# Patient Record
Sex: Male | Born: 1990 | Race: White | Hispanic: No | Marital: Single | Smoking: Former smoker
Health system: Southern US, Community
[De-identification: ages and names within clinical notes are randomized; demographics above are authoritative.]

## PROBLEM LIST (undated history)

## (undated) DIAGNOSIS — R002 Palpitations: Secondary | ICD-10-CM

## (undated) DIAGNOSIS — I10 Essential (primary) hypertension: Secondary | ICD-10-CM

## (undated) HISTORY — DX: Palpitations: R00.2

## (undated) HISTORY — DX: Essential (primary) hypertension: I10

## (undated) HISTORY — PX: HERNIA REPAIR: SHX51

---
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
## 2011-10-04 ENCOUNTER — Emergency Department: Payer: Self-pay | Admitting: Emergency Medicine

## 2012-04-28 ENCOUNTER — Emergency Department: Payer: Self-pay | Admitting: Emergency Medicine
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
## 2013-10-18 IMAGING — CT CT HEAD WITHOUT CONTRAST
2 series · 16 of 30 positions shown, 20 images · non-contrast
Comparison: none

REASON FOR EXAM: headache/nausea/dizziness
COMMENTS:

PROCEDURE:     CT  - CT HEAD WITHOUT CONTRAST  - October 04, 2011  [DATE]
RESULT:     Technique: Helical 5mm sections were obtained from the skull
base to the vertex without administration of intravenous contrast.

[Series 2: without · axial · non-contrast · 0.43mm/px · z∈[+230,+350]mm · 13 of 29 slices shown, 17 images]
[im 3/29  brain]
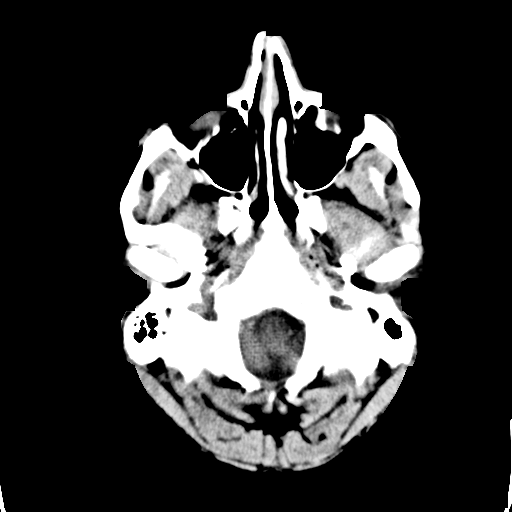
[im 3/29  bone]
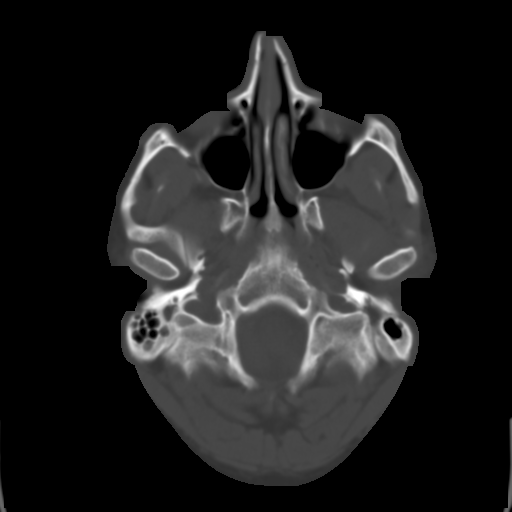
[im 5/29  brain]
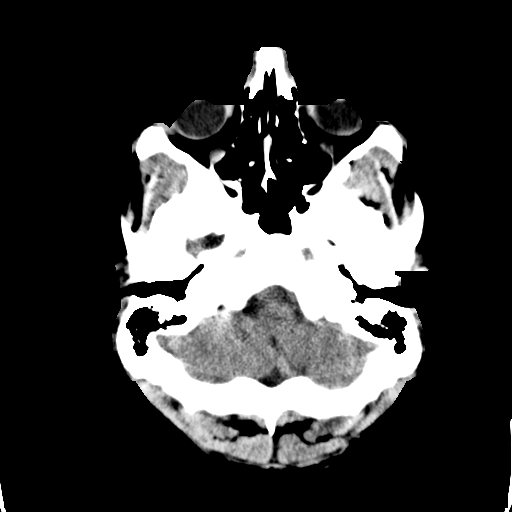
[im 7/29  brain]
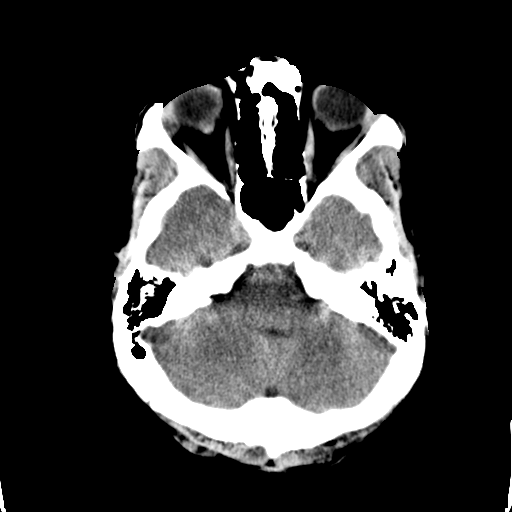
[im 9/29  brain]
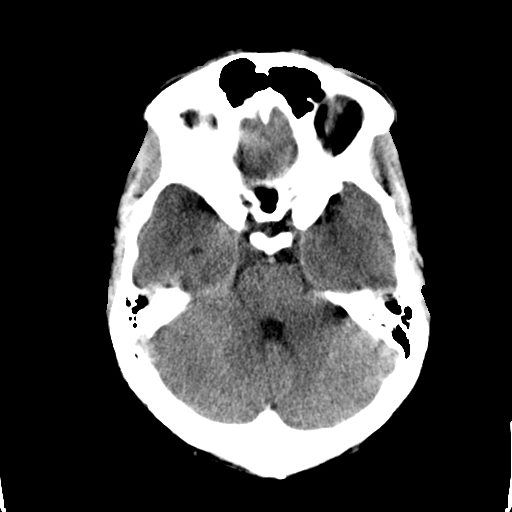
[im 11/29  brain]
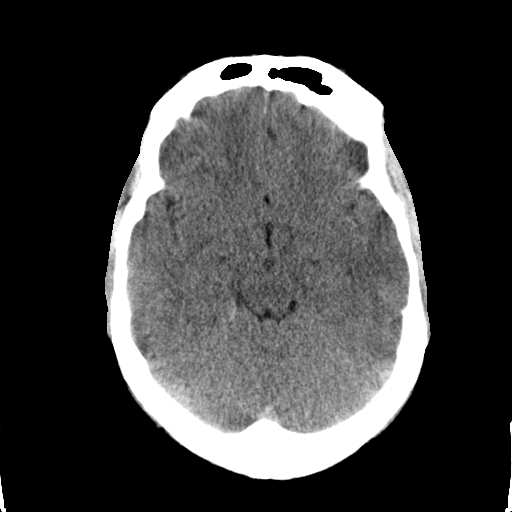
[im 11/29  bone]
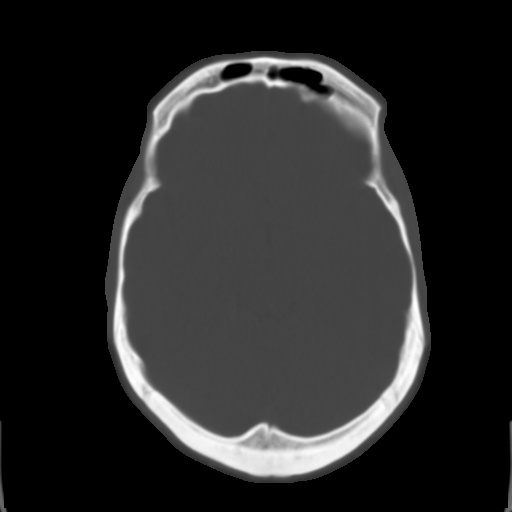
[im 13/29  brain]
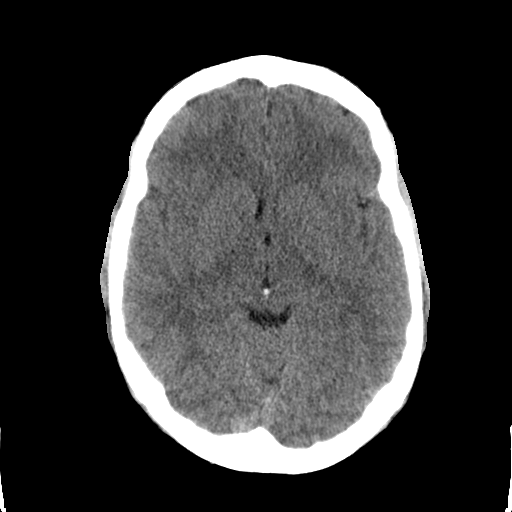
[im 15/29  brain]
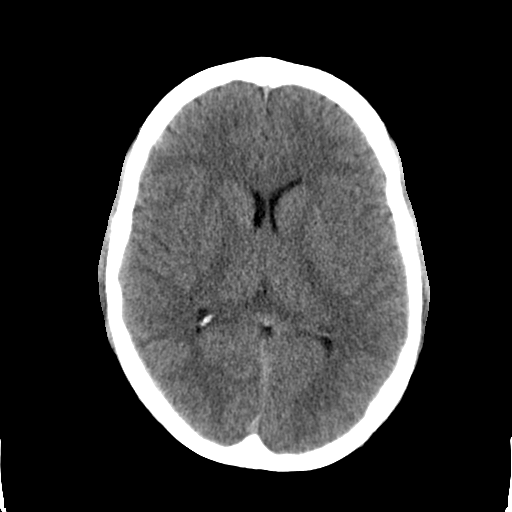
[im 17/29  brain]
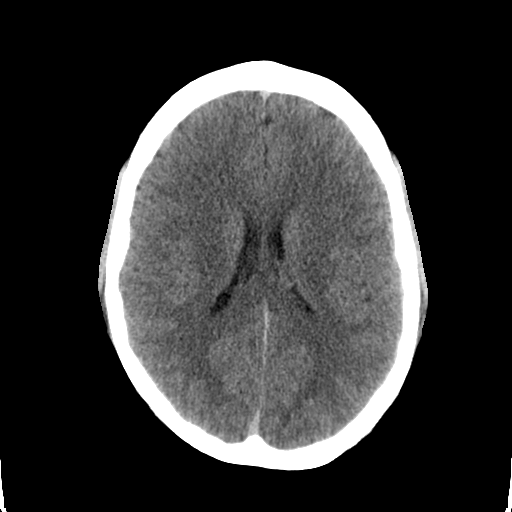
[im 19/29  brain]
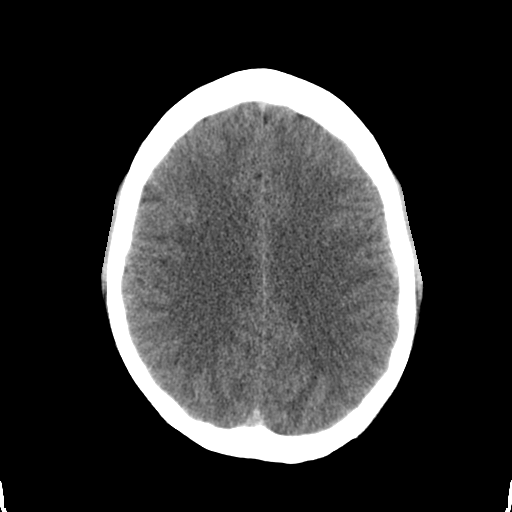
[im 19/29  bone]
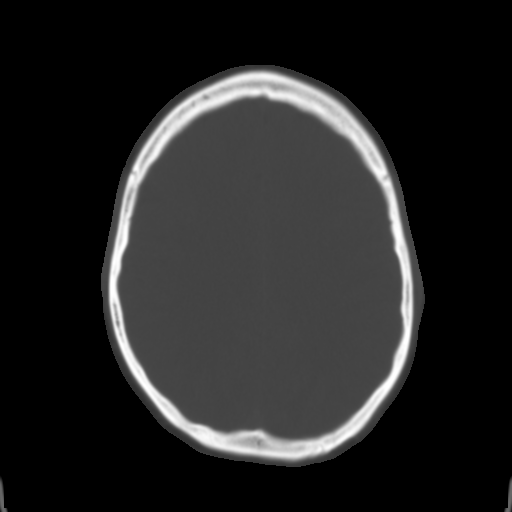
[im 21/29  brain]
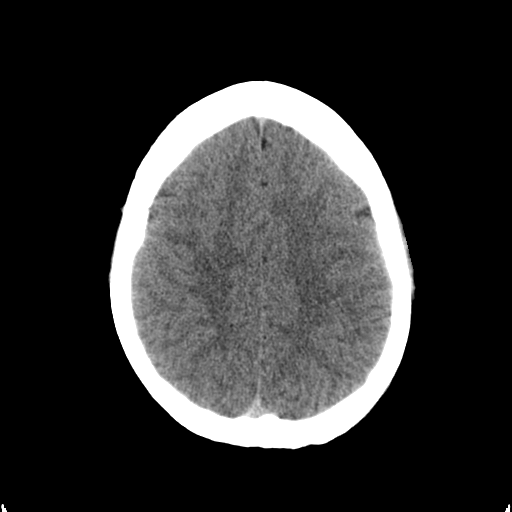
[im 23/29  brain]
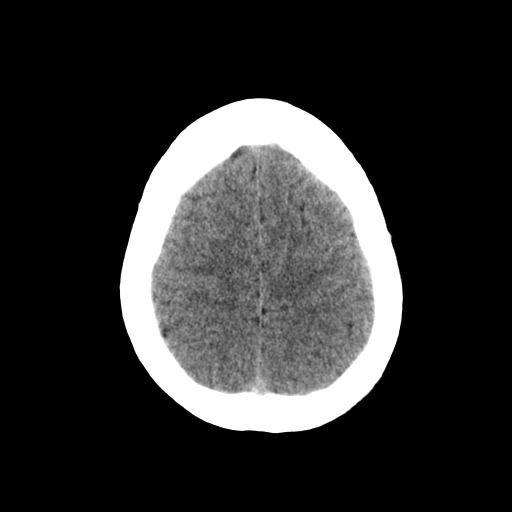
[im 25/29  brain]
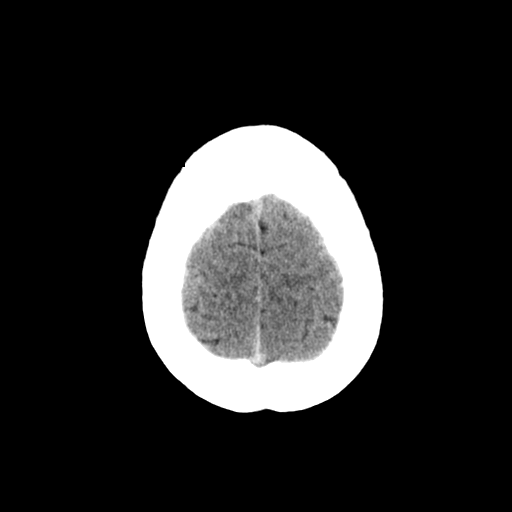
[im 27/29  brain]
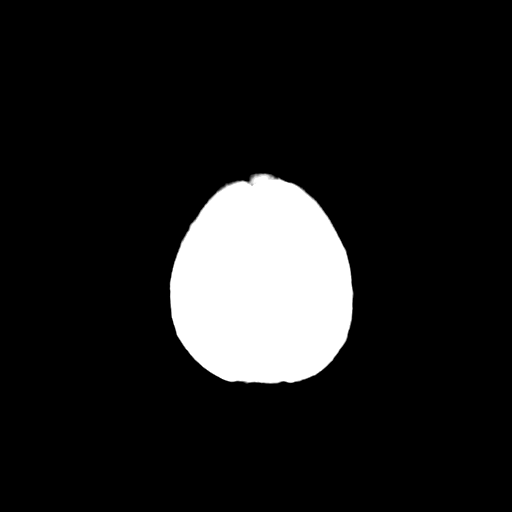
[im 27/29  bone]
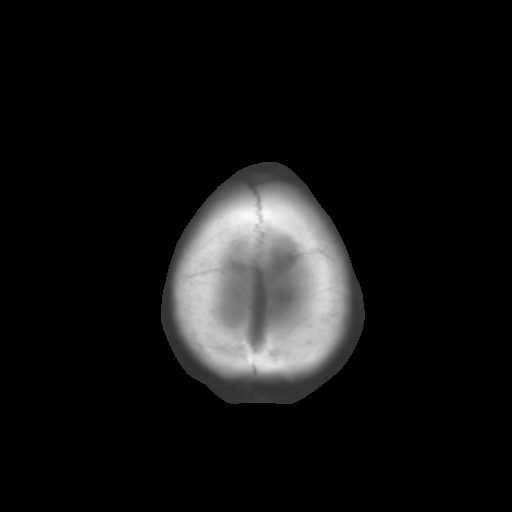

[Series 3: bone · axial · 0.43mm/px · z∈[+230,+270]mm · 3 of 29 slices shown]
[im 3/29  bone]
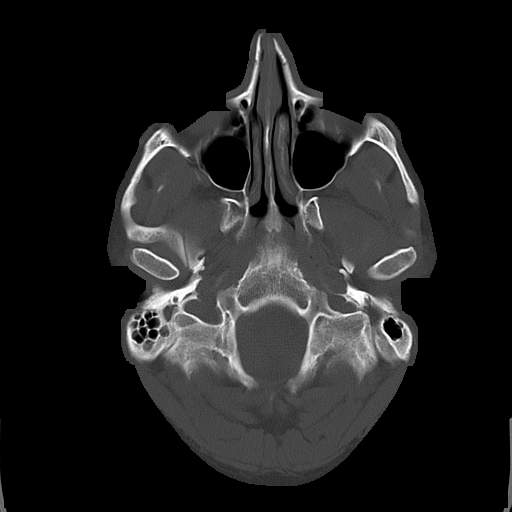
[im 7/29  bone]
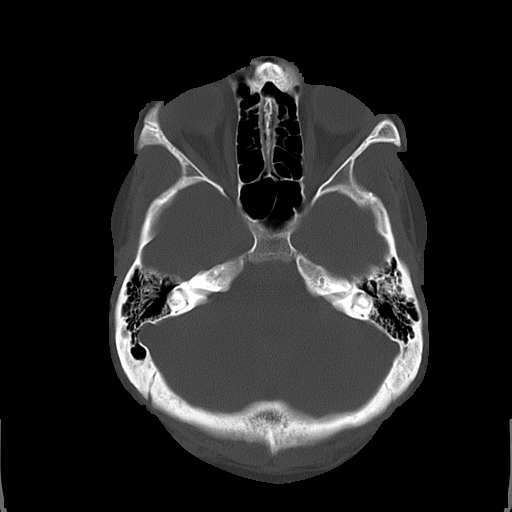
[im 11/29  bone]
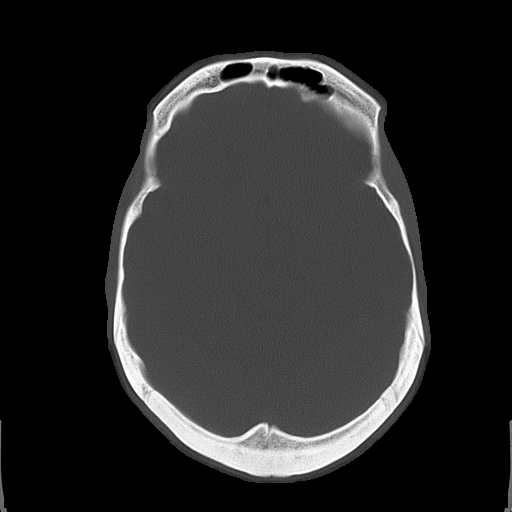

[16 of 30 positions shown; findings below may reference images not displayed]

FINDINGS: There is not evidence of intra-axial fluid collections. There is
no evidence of acute hemorrhage or secondary signs reflecting mass effect or
subacute or chronic focal territorial infarction. The osseous structures
demonstrate no evidence of a depressed skull fracture. If there is
persistent concern clinical follow-up with MRI is recommended.

Incidental note is made of minimally displaced nasal bone fractures seen on
the prior maxillofacial CT.
IMPRESSION: 1. No evidence of acute intracranial abnormalitites.

2. Dan Kato Bodin, PA was informed of these findings via a preliminary faxed
report.

## 2015-12-22 ENCOUNTER — Emergency Department
Admission: EM | Admit: 2015-12-22 | Discharge: 2015-12-23 | Disposition: A | Discharge status: Home / Self Care | Payer: 59 | Attending: Emergency Medicine | Admitting: Emergency Medicine

## 2015-12-22 ENCOUNTER — Encounter: Payer: Self-pay | Admitting: *Deleted

## 2015-12-22 DIAGNOSIS — R45851 Suicidal ideations: Secondary | ICD-10-CM

## 2015-12-22 DIAGNOSIS — F4325 Adjustment disorder with mixed disturbance of emotions and conduct: Secondary | ICD-10-CM | POA: Diagnosis not present

## 2015-12-22 DIAGNOSIS — Z5181 Encounter for therapeutic drug level monitoring: Secondary | ICD-10-CM | POA: Diagnosis not present

## 2015-12-22 DIAGNOSIS — Z046 Encounter for general psychiatric examination, requested by authority: Secondary | ICD-10-CM

## 2015-12-22 DIAGNOSIS — F4320 Adjustment disorder, unspecified: Secondary | ICD-10-CM | POA: Insufficient documentation
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
## 2015-12-22 NOTE — ED Notes (Signed)
Pt voices his concerns with being here and not understanding why. Pt provided with information on why he is here and what the process will be. Pt voices understanding at this time.

## 2015-12-23 DIAGNOSIS — Z046 Encounter for general psychiatric examination, requested by authority: Secondary | ICD-10-CM

## 2015-12-23 DIAGNOSIS — F4325 Adjustment disorder with mixed disturbance of emotions and conduct: Secondary | ICD-10-CM

## 2015-12-23 LAB — CBC
HEMATOCRIT: 43.7 % (ref 40.0–52.0)
HEMOGLOBIN: 15.7 g/dL (ref 13.0–18.0)
MCH: 30 pg (ref 26.0–34.0)
MCHC: 35.9 g/dL (ref 32.0–36.0)
MCV: 83.5 fL (ref 80.0–100.0)
Platelets: 234 10*3/uL (ref 150–440)
RBC: 5.23 MIL/uL (ref 4.40–5.90)
RDW: 12.8 % (ref 11.5–14.5)
WBC: 10.2 10*3/uL (ref 3.8–10.6)

## 2015-12-23 LAB — BASIC METABOLIC PANEL
ANION GAP: 8 (ref 5–15)
BUN: 14 mg/dL (ref 6–20)
CALCIUM: 9.1 mg/dL (ref 8.9–10.3)
CHLORIDE: 102 mmol/L (ref 101–111)
CO2: 28 mmol/L (ref 22–32)
CREATININE: 0.97 mg/dL (ref 0.61–1.24)
GFR calc non Af Amer: >60 mL/min (ref >60)
Glucose, Bld: 97 mg/dL (ref 65–99)
Potassium: 3.6 mmol/L (ref 3.5–5.1)
SODIUM: 138 mmol/L (ref 135–145)

## 2015-12-23 LAB — URINE DRUG SCREEN, QUALITATIVE (ARMC ONLY)
Amphetamines, Ur Screen: NOT DETECTED
BARBITURATES, UR SCREEN: NOT DETECTED
Benzodiazepine, Ur Scrn: NOT DETECTED
COCAINE METABOLITE, UR ~~LOC~~: NOT DETECTED
Cannabinoid 50 Ng, Ur ~~LOC~~: NOT DETECTED
MDMA (ECSTASY) UR SCREEN: NOT DETECTED
METHADONE SCREEN, URINE: NOT DETECTED
Opiate, Ur Screen: NOT DETECTED
Phencyclidine (PCP) Ur S: NOT DETECTED
TRICYCLIC, UR SCREEN: NOT DETECTED

## 2015-12-23 LAB — SALICYLATE LEVEL

## 2015-12-23 LAB — ACETAMINOPHEN LEVEL

## 2015-12-23 LAB — ETHANOL: Alcohol, Ethyl (B): <5 mg/dL (ref <5)

## 2015-12-23 NOTE — ED Notes (Signed)
Patient resting quietly in room. No noted distress or abnormal behaviors noted. Will continue 15 minute checks and observation by security camera for safety. 

## 2015-12-23 NOTE — Discharge Instructions (Signed)
You have been seen in the Emergency Department (ED)  today for a psychiatric complaint.  You have been evaluated by psychiatry and we believe you are safe to be discharged from the hospital.   ° °Please return to the Emergency Department (ED)  immediately if you have ANY thoughts of hurting yourself or anyone else, so that we may help you. ° °Please avoid alcohol and drug use. ° °Follow up with your doctor and/or therapist as soon as possible regarding today's ED  visit.  ° °You may call crisis hotline for [place] at [phone]. ° °

## 2015-12-23 NOTE — ED Notes (Signed)
Patient asleep in room. No noted distress or abnormal behavior. Will continue 15 minute checks and observation by security cameras for safety. 

## 2015-12-23 NOTE — ED Notes (Signed)
Patient currently denies SI/HI/AVH and pain. Patient insists that he said that he wanted to lay in front of a car because his fiance left with his child without saying where she was going. He states that he is connected with a therapist and has two jobs. He consistently requests to leave stating "this is not fair". Nurse provided reassurance and oriented patient to the unit. Maintained on 15 minute checks and observation by security camera for safety.
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
## 2015-12-23 NOTE — ED Provider Notes (Signed)
Patient evaluated and cleared by psychiatry for discharge. IVC lifted by psychiatry. Patient was referred to counseling.  Nita Sicklearolina Andriea Hasegawa, MD 12/23/15 1932

## 2015-12-23 NOTE — ED Notes (Signed)
Patient currently in room talking on the phone. Patient continuously asks when the doctor will arrive to evaluate and release him. Nurse continues to reassure patient. Patient remains cooperative. Maintained on 15 minute checks and observation by security camera for safety.

## 2015-12-23 NOTE — ED Notes (Signed)
Pateint currently in dayroom waiting for the psychiatrist to evaluate him. Patient states that he is feeling anxious to leave and that he has never been in a psychiatric hospital before. Patient is still calm and states that he will remain calm until psychiatrist comes. Maintained on 15 minute checks and observation by security camera for safety.
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
## 2015-12-23 NOTE — ED Notes (Signed)

## 2015-12-23 NOTE — ED Notes (Signed)

## 2015-12-23 NOTE — ED Provider Notes (Signed)
[facility] Emergency Department Provider Note   ____________________________________________  Time seen: Approximately 0045 AM  I have reviewed the triage vital signs and the nursing notes.   HISTORY  Chief Complaint IVC     HPI Donald Morasustin C Hill is a 25 y.o. male who comes into the hospital today due to a misunderstanding. The patient reports that he got into an argument with his wife and she wanted to leave. He reports that he told her he would lay in front of the car so she couldn't leave. The patient's wife called the police and the police brought him here. The patient denies any intent to harm himself. He reports that he just wants to go home. He reports that he's never done anything like this in the past denies any drinking or drugs and reports he has no problems with depression or anxiety. He reports that he did not do anything and he just wants to go home. The patient has no abdominal pain no chest pain or shortness of breath or blurry vision no hallucinations at this time.   History reviewed. No pertinent past medical history.  There are no active problems to display for this patient.   History reviewed. No pertinent past surgical history.  No current outpatient prescriptions  Allergies Review of patient's allergies indicates no known allergies.  History reviewed. No pertinent family history.  Social History Social History  Substance Use Topics  . Smoking status: Never Smoker   . Smokeless tobacco: None  . Alcohol Use: No    Review of Systems Constitutional: No fever/chills Eyes: No visual changes. ENT: No sore throat. Cardiovascular: Denies chest pain. Respiratory: Denies shortness of breath. Gastrointestinal: No abdominal pain.  No nausea, no vomiting.  No diarrhea.  No constipation. Genitourinary: Negative for dysuria. Musculoskeletal: Negative for back pain. Skin: Negative for rash. Neurological: Negative for headaches, focal  weakness or numbness.  10-point ROS otherwise negative.  ____________________________________________   PHYSICAL EXAM:  VITAL SIGNS: ED Triage Vitals  Enc Vitals Group     BP 12/22/15 2225 135/84 mmHg     Pulse Rate 12/22/15 2225 70     Resp 12/22/15 2225 18     Temp 12/22/15 2225 98.6 F (37 C)     Temp Source 12/22/15 2225 Oral     SpO2 12/22/15 2225 98 %     Weight 12/22/15 2225 235 lb (106.595 kg)     Height 12/22/15 2225 5\' 10"  (1.778 m)     Head Cir --      Peak Flow --      Pain Score --      Pain Loc --      Pain Edu? --      Excl. in GC? --     Constitutional: Alert and oriented. Well appearing and in no acute distress. Eyes: Conjunctivae are normal. PERRL. EOMI. Head: Atraumatic. Nose: No congestion/rhinnorhea. Mouth/Throat: Mucous membranes are moist.  Oropharynx non-erythematous. Cardiovascular: Normal rate, regular rhythm. Grossly normal heart sounds.  Good peripheral circulation. Respiratory: Normal respiratory effort.  No retractions. Lungs CTAB. Gastrointestinal: Soft and nontender. No distention. Positive bowel sounds Musculoskeletal: No lower extremity tenderness nor edema.   Neurologic:  Normal speech and language.  Skin:  Skin is warm, dry and intact.  Psychiatric: Mood and affect are normal.   ____________________________________________   LABS (all labs ordered are listed, but only abnormal results are displayed)  Labs Reviewed  ACETAMINOPHEN LEVEL - Abnormal; Notable for the following:  Acetaminophen (Tylenol), Serum <10 (*)    All other components within normal limits  CBC  BASIC METABOLIC PANEL  ETHANOL  URINE DRUG SCREEN, QUALITATIVE ([facility] ONLY)  SALICYLATE LEVEL   ____________________________________________  EKG  none ____________________________________________  RADIOLOGY  none ____________________________________________   PROCEDURES  Procedure(s) performed: None  Procedures  Critical Care performed:  No  ____________________________________________   INITIAL IMPRESSION / ASSESSMENT AND PLAN / ED COURSE  Pertinent labs & imaging results that were available during my care of the patient were reviewed by me and considered in my medical decision making (see chart for details).  This is a 25 year old male who comes into the hospital today with a concern for suicidal ideation. The patient was involuntarily committed by the police. He reports that he has not done anything wrong. The patient has some blood work that is pending at this time and will have him evaluated by psych. ____________________________________________   FINAL CLINICAL IMPRESSION(S) / ED DIAGNOSES  Final diagnoses:  Emotional crisis  Passive suicidal ideations      NEW MEDICATIONS STARTED DURING THIS VISIT:  New Prescriptions   No medications on file     Note:  This document was prepared using Dragon voice recognition software and may include unintentional dictation errors.    Rebecka Apley, MD 12/23/15 [phone]

## 2015-12-23 NOTE — ED Notes (Signed)
Pt. To BHU from ED ambulatory without difficulty, to room  BHU-6. Report from Vanessa RN. [employer]. Is alert and oriented, warm and dry in no distress. Pt. Denies SI, HI, and AVH. Pt. Calm and cooperative but tearful. Pt upset that he is here in the BHU and wanting to know when Dr. Stann MainlandWill be here so he can speak with him. Pt states he has a appointment with his therapist this afternoon.  Pt. Made aware of security cameras and Q15 minute rounds. Pt. Encouraged to let Nursing staff know of any concerns or needs.

## 2015-12-23 NOTE — ED Notes (Signed)
Patient grandmother called x2 wishing to speak with patient. Patient requested not to have any phone calls from her at this time.

## 2015-12-23 NOTE — ED Notes (Signed)
ED BHU PLACEMENT JUSTIFICATION Is the patient under IVC or is there intent for IVC: Yes.   Is the patient medically cleared: Yes.   Is there vacancy in the ED BHU: Yes.   Is the population mix appropriate for patient: Yes.   Is the patient awaiting placement in inpatient or outpatient setting: No. Has the patient had a psychiatric consult: No. Survey of unit performed for contraband, proper placement and condition of furniture, tampering with fixtures in bathroom, shower, and each patient room: Yes.  ; Findings: NA APPEARANCE/BEHAVIOR calm, cooperative and adequate rapport can be established NEURO ASSESSMENT Orientation: time, place and person Hallucinations: No.None noted (Hallucinations) Speech: Normal Gait: normal RESPIRATORY ASSESSMENT Normal expansion.  Clear to auscultation.  No rales, rhonchi, or wheezing. CARDIOVASCULAR ASSESSMENT regular rate and rhythm, S1, S2 normal, no murmur, click, rub or gallop GASTROINTESTINAL ASSESSMENT soft, nontender, BS WNL, no r/g EXTREMITIES normal strength, tone, and muscle mass PLAN OF CARE Provide calm/safe environment. Vital signs assessed twice daily. ED BHU Assessment once each 12-hour shift. Collaborate with intake RN daily or as condition indicates. Assure the ED provider has rounded once each shift. Provide and encourage hygiene. Provide redirection as needed. Assess for escalating behavior; address immediately and inform ED provider.  Assess family dynamic and appropriateness for visitation as needed: Yes.  ; If necessary, describe findings: NA Educate the patient/family about BHU procedures/visitation: Yes.  ; If necessary, describe findings: NA  

## 2024-03-08 ENCOUNTER — Encounter: Payer: Self-pay | Admitting: Nurse Practitioner

## 2024-03-08 ENCOUNTER — Ambulatory Visit: Payer: Self-pay | Attending: Nurse Practitioner | Admitting: Nurse Practitioner

## 2024-03-08 ENCOUNTER — Ambulatory Visit: Payer: Self-pay

## 2024-03-08 VITALS — BP 126/88 | HR 71 | Ht 70.0 in | Wt 230.0 lb

## 2024-03-08 DIAGNOSIS — R072 Precordial pain: Secondary | ICD-10-CM

## 2024-03-08 DIAGNOSIS — Z720 Tobacco use: Secondary | ICD-10-CM

## 2024-03-08 DIAGNOSIS — R000 Tachycardia, unspecified: Secondary | ICD-10-CM

## 2024-03-08 DIAGNOSIS — R002 Palpitations: Secondary | ICD-10-CM

## 2024-03-08 NOTE — Patient Instructions (Signed)
 Medication Instructions:  Your physician recommends that you continue on your current medications as directed. Please refer to the Current Medication list given to you today.  *If you need a refill on your cardiac medications before your next appointment, please call your pharmacy*  Lab Work: A1C today  Testing/Procedures: Your physician has requested that you have an echocardiogram. Echocardiography is a painless test that uses sound waves to create images of your heart. It provides your doctor with information about the size and shape of your heart and how well your heart's chambers and valves are working. This procedure takes approximately one hour. There are no restrictions for this procedure. Please do NOT wear cologne, perfume, aftershave, or lotions (deodorant is allowed). Please arrive 15 minutes prior to your appointment time.  Please note: We ask at that you not bring children with you during ultrasound (echo/ vascular) testing. Due to room size and safety concerns, children are not allowed in the ultrasound rooms during exams. Our front office staff cannot provide observation of children in our lobby area while testing is being conducted. An adult accompanying a patient to their appointment will only be allowed in the ultrasound room at the discretion of the ultrasound technician under special circumstances. We apologize for any inconvenience.   Calcium Score Test   ZIO XT- Long Term Monitor Instructions  Your physician has requested you wear a ZIO patch monitor for 14 days.  This is a single patch monitor. [employer] supplies one patch monitor per enrollment. Additional stickers are not available. Please do not apply patch if you will be having a Nuclear Stress Test,  Echocardiogram, Cardiac CT, MRI, or Chest Xray during the period you would be wearing the  monitor. The patch cannot be worn during these tests. You cannot remove and re-apply the  ZIO XT patch monitor.  Your ZIO  patch monitor will be mailed 3 day USPS to your address on file. It may take 3-5 days  to receive your monitor after you have been enrolled.  Once you have received your monitor, please review the enclosed instructions. Your monitor  has already been registered assigning a specific monitor serial # to you.  Billing and Patient Assistance Program Information  We have supplied [employer] with any of your insurance information on file for billing purposes. [employer] offers a sliding scale Patient Assistance Program for patients that do not have  insurance, or whose insurance does not completely cover the cost of the ZIO monitor.  You must apply for the Patient Assistance Program to qualify for this discounted rate.  To apply, please call [employer] at [phone], select option 4, select option 2, ask to apply for  Patient Assistance Program. Meredeth will ask your household income, and how many people  are in your household. They will quote your out-of-pocket cost based on that information.  [employer] will also be able to set up a 66-month, interest-free payment plan if needed.  Applying the monitor   Shave hair from upper left chest.  Hold abrader disc by orange tab. Rub abrader in 40 strokes over the upper left chest as  indicated in your monitor instructions.  Clean area with 4 enclosed alcohol pads. Let dry.  Apply patch as indicated in monitor instructions. Patch will be placed under collarbone on left  side of chest with arrow pointing upward.  Rub patch adhesive wings for 2 minutes. Remove white label marked 1. Remove the white  label marked 2. Rub patch adhesive wings for 2 additional  minutes.  While looking in a mirror, press and release button in center of patch. A small green light will  flash 3-4 times. This will be your only indicator that the monitor has been turned on.  Do not shower for the first 24 hours. You may shower after the first 24 hours.  Press the button if you feel a  symptom. You will hear a small click. Record Date, Time and  Symptom in the Patient Logbook.  When you are ready to remove the patch, follow instructions on the last 2 pages of Patient  Logbook. Stick patch monitor onto the last page of Patient Logbook.  Place Patient Logbook in the blue and white box. Use locking tab on box and tape box closed  securely. The blue and white box has prepaid postage on it. Please place it in the mailbox as  soon as possible. Your physician should have your test results approximately 7 days after the  monitor has been mailed back to [facility].  Call [facility] Customer Care at [phone] if you have questions regarding  your ZIO XT patch monitor. Call them immediately if you see an orange light blinking on your  monitor.  If your monitor falls off in less than 4 days, contact our Monitor department at [phone].  If your monitor becomes loose or falls off after 4 days call [employer] at [phone] for  suggestions on securing your monitor  Follow-Up: At [facility], you and your health needs are our priority.  As part of our continuing mission to provide you with exceptional heart care, our providers are all part of one team.  This team includes your primary Cardiologist (physician) and Advanced Practice Providers or APPs (Physician Assistants and Nurse Practitioners) who all work together to provide you with the care you need, when you need it.  Your next appointment:   6-8 week(s)  Provider:   Dr. Darron or Any APP in [place]    We recommend signing up for the patient portal called MyChart.  Sign up information is provided on this After Visit Summary.  MyChart is used to connect with patients for Virtual Visits (Telemedicine).  Patients are able to view lab/test results, encounter notes, upcoming appointments, etc.  Non-urgent messages can be sent to your provider as well.   To learn more about what you can do with MyChart, go to  [email or web address].

## 2024-03-08 NOTE — Progress Notes (Unsigned)
Enrolled for [employer] to mail a ZIO XT long term holter monitor to the patients address on file.   Dr. Fletcher Anon to read.

## 2024-03-08 NOTE — Progress Notes (Signed)
 Office Visit    Patient Name: Donald Hill Date of Encounter: 03/08/2024  Primary Care Provider:  Albina GORMAN Dine, MD Primary Cardiologist:  Donald Cage, MD  Chief Complaint    33 year old male with a history of palpitations, elevated BP reading, and tobacco use who presents for heart first clinic new patient evaluation in the setting of chest pain/palpitations.  Past Medical History    Past Medical History:  Diagnosis Date   HTN (hypertension)    Palpitations    No past surgical history on file.  Allergies  No Known Allergies   Labs/Other Studies Reviewed    The following studies were reviewed today:     Recent Labs: No results found for requested labs within last 365 days.  Recent Lipid Panel No results found for: CHOL, TRIG, HDL, CHOLHDL, VLDL, LDLCALC, LDLDIRECT  History of Present Illness    33 year old male with the above past medical history including palpitations, elevated BP reading, and tobacco use.  He presents today for heart first clinic new patient evaluation.  He has a history of palpitations, previously on metoprolol.  He was also referred for sleep study, which was never completed. He does not know details of his family's past medical history however, he does share that his mother is a patient of Dr. Renato.  He has not seen a doctor in over 7 years.  He drinks at least 32 ounces of caffeinated unsweetened tea a day.  He reports a history of snoring, no observed apnea. 3 days ago he was working in [employer]  (he works Engineer, technical sales).  He ate a large meal of fast food and drank 2 large Coca[employer]'s and then started to drive back to [place] .  Approximately 1-1/2 to 2 hours later he noted an elevated heart rate, he felt his heart beating fast, squeezing pain in his chest, lightheadedness, cold sweats, shakiness.  He stopped and got out of his car.  He noted difficulty walking at the time.   Symptoms lasted initially  for 1 minute and resolved spontaneously, then returned, lasting greater than 15-20 minutes.  He called EMS.  He was evaluated in the ED on 03/05/2024, though his symptoms resolved once in the ED. Chest x-ray was unremarkable, EKG was stable, troponin was negative x 2, D-dimer was negative. He reports that he saw his heart rate go up to 150 bpm on the cardiac monitor in the ED, though there is no documentation of this.  He was advised to follow-up with a cardiologist. He reports rare alcohol use. 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  Prior to vaping, he smoked cigarettes for approximately 5 years, one half PPD.  He does report daily marijuana use. Though he states he has not used marijuana since his episode.  Has had some mild headaches. He does not exercise, diet consists mainly of fast food.  He is divorced. Of note, he does not have insurance.    Home Medications    No current outpatient medications on file.   No current facility-administered medications for this visit.     Review of Systems    He denies dyspnea, pnd, orthopnea, n, v, dizziness, syncope, edema, weight gain, or early satiety. All other systems reviewed and are otherwise negative except as noted above.   Physical Exam    VS:  BP 126/88   Pulse 71   Ht 5' 10 (1.778 m)   Wt 230 lb (104.3 kg)   SpO2 96%  BMI 33.00 kg/m   GEN: Well nourished, well developed, in no acute distress. HEENT: normal. Neck: Supple, no JVD, carotid bruits, or masses. Cardiac: RRR, no murmurs, rubs, or gallops. No clubbing, cyanosis, edema.  Radials/DP/PT 2+ and equal bilaterally.  Respiratory:  Respirations regular and unlabored, clear to auscultation bilaterally. GI: Soft, nontender, nondistended, BS + x 4. MS: no deformity or atrophy. Skin: warm and dry, no rash. Neuro:  Strength and sensation are intact. Psych: Normal affect.  Accessory Clinical Findings    ECG personally reviewed by me today - EKG  Interpretation Date/Time:  Thursday March 08 2024 10:23:11 EDT Ventricular Rate:  71 PR Interval:  122 QRS Duration:  80 QT Interval:  376 QTC Calculation: 408 R Axis:   70  Text Interpretation: Normal sinus rhythm Normal ECG No previous ECGs available Confirmed by Donald Hill ([id number]) on 03/08/2024 10:24:11 AM  - no acute changes.   Lab Results  Component Value Date   WBC 10.2 12/22/2015   HGB 15.7 12/22/2015   HCT 43.7 12/22/2015   MCV 83.5 12/22/2015   PLT 234 12/22/2015   Lab Results  Component Value Date   CREATININE 0.97 12/22/2015   BUN 14 12/22/2015   NA 138 12/22/2015   K 3.6 12/22/2015   CL 102 12/22/2015   CO2 28 12/22/2015   No results found for: ALT, AST, GGT, ALKPHOS, BILITOT No results found for: CHOL, HDL, LDLCALC, LDLDIRECT, TRIG, CHOLHDL  No results found for: HGBA1C  Assessment & Plan    1. Tachycardia/precordial pain: He was evaluated in the ED 3 days ago in the setting of chest pain, elevated heart rate.  ED workup was unremarkable.  He denies any further symptoms.  Does have a history of prior palpitations, previously on metoprolol.  Through shared decision making, will check echocardiogram, 14-day ZIO monitor.  Will check coronary calcium score,  A1c per pt request.  Reviewed ED precautions, vagal maneuvers. He does not have insurance, however, he still wishes to pursue testing.  I reached out to our clinical social worker to see if he qualifies for any form of assistance.  CSW will follow-up with the patient.  2. Tobacco use: He vapes and smokes marijuana daily.  Full cessation advised.  3.  History of snoring: He reports a history of snoring.  Sleep study was previously recommended but not completed.  Continue to assess need for possible sleep study in the future, will defer for now.    4. Disposition:  Follow-up in 6 to 8 weeks in [place].  Patient requests to establish with Donald Hill.      Hill Donald Daneen,  NP 03/08/2024, 11:05 AM

## 2024-03-09 ENCOUNTER — Ambulatory Visit (HOSPITAL_COMMUNITY)
Admission: RE | Admit: 2024-03-09 | Discharge: 2024-03-09 | Disposition: A | Discharge status: Home / Self Care | Payer: Self-pay | Source: Ambulatory Visit | Attending: Nurse Practitioner | Admitting: Nurse Practitioner

## 2024-03-09 ENCOUNTER — Ambulatory Visit: Payer: Self-pay | Admitting: Nurse Practitioner

## 2024-03-09 ENCOUNTER — Telehealth: Payer: Self-pay | Admitting: Licensed Clinical Social Worker

## 2024-03-09 DIAGNOSIS — R072 Precordial pain: Secondary | ICD-10-CM | POA: Insufficient documentation

## 2024-03-09 DIAGNOSIS — R000 Tachycardia, unspecified: Secondary | ICD-10-CM | POA: Insufficient documentation

## 2024-03-09 LAB — HEMOGLOBIN A1C
Est. average glucose Bld gHb Est-mCnc: 105 mg/dL
Hgb A1c MFr Bld: 5.3 % (ref 4.8–5.6)

## 2024-03-09 NOTE — Progress Notes (Signed)
 Heart and Vascular Care Navigation  03/09/2024  Donald Hill 10/26/1990 [id number]  Reason for Referral: self pay, testing ordered Patient is participating in a Managed Medicaid Plan: No, self pay only  Engaged with patient by telephone for initial visit for Heart and Vascular Care Coordination.                                                                                                   Assessment:                                     LCSW spoke with pt today at [phone]. Introduced self, role, reason for call. Confirmed home address and DOB. Pt works full time, no benefits through his job. Shares he has not looked into Medicaid, had looked at marketplace (spoken with health plan) and he feels like as he is in good health he does not feel the cost quoted would be worth it. I cautioned that the testing ordered and hospital visits/ED visits that are unexpected are often more expensive even with a discount, than anticipated and for him to consider the risk/benefit of going without insurance. No current concerns with food, transportation, housing or utilities.   We discussed options such as looking into Medicaid (although likely over income), speaking with a marketplace navigator to see if any other more affordable options exist, and the [employer] program.   Pt shares he doesn't usually check his mail but is going to receive monitor through mail and is agreeable to regularly checking it to make sure he receives any resources sent.   HRT/VAS Care Coordination     Patients Home Cardiology Office [facility]   Outpatient Care Team Social Worker   Social Worker Name: Marit Lark, [place], [phone]   Living arrangements for the past 2 months Single Family Home   Patient Current Insurance Coverage Self-Pay   Patient Has Concern With Paying Medical Bills Yes   Patient Concerns With Medical Bills self pay, works full time but no benefits, testing ordered    Medical Bill Referrals: CAFA, Marketplace, will send Medicaid info but over income per income report   Does Patient Have Prescription Coverage? No       Social History:                                                                             SDOH Screenings   Food Insecurity: No Food Insecurity (03/09/2024)  Housing: Low Risk  (03/09/2024)  Transportation Needs: No Transportation Needs (03/09/2024)  Utilities: Not At Risk (03/09/2024)  Financial Resource Strain: Low Risk  (03/09/2024)  Tobacco Use: Unknown (03/08/2024)  Health Literacy: Adequate Health Literacy (03/09/2024)    SDOH Interventions: Financial Resources:  Financial Strain Interventions: Artist (provided info about Medicaid, CAFA, and Environmental education officer) Editor, commissioning for [employer] Program  Food Insecurity:  Food Insecurity Interventions: Intervention Not Indicated  Housing Insecurity:  Housing Interventions: Intervention Not Indicated  Transportation:   Transportation Interventions: Intervention Not Indicated     Other Care Navigation Interventions:     Provided Pharmacy assistance resources  Currently on no medications, discussed [facility] and [facility] pharmacies if prescribed anything   Follow-up plan:   LCSW has sent the following to pt: my card, Pharmacist, hospital, Biomedical engineer. I will also send Monday via MyChart and f/u to ensure pt has resources as needed. Pt also advised that his monitor has it's own assistance through the manufacturer and to look for further instructions/reach out to monitor team if needed.

## 2024-03-11 ENCOUNTER — Encounter: Payer: Self-pay | Admitting: Nurse Practitioner

## 2024-03-13 ENCOUNTER — Ambulatory Visit: Admitting: Cardiovascular Disease

## 2024-03-20 ENCOUNTER — Telehealth: Payer: Self-pay | Admitting: Licensed Clinical Social Worker

## 2024-03-20 NOTE — Telephone Encounter (Signed)
 H&V Care Navigation CSW Progress Note  Clinical Social Worker contacted patient by phone to f/u on assistance applications sent through mail. Pt reached at [phone]. Confirmed these were received and pt at this time has no additional questions/concerns. Encouraged him to call me as needed while he completes these if he elects to apply.  Patient is participating in a Managed Medicaid Plan:  No, self pay only  SDOH Screenings   Food Insecurity: No Food Insecurity (03/09/2024)  Housing: Low Risk  (03/09/2024)  Transportation Needs: No Transportation Needs (03/09/2024)  Utilities: Not At Risk (03/09/2024)  Financial Resource Strain: Low Risk  (03/09/2024)  Tobacco Use: Unknown (03/11/2024)  Health Literacy: Adequate Health Literacy (03/09/2024)    Marit Lark, MSW, LCSW Clinical Social Worker II [facility] Heart/Vascular Care Navigation  [phone]- work cell phone (preferred)

## 2024-03-23 ENCOUNTER — Ambulatory Visit: Payer: Self-pay | Admitting: Internal Medicine

## 2024-03-27 ENCOUNTER — Encounter: Payer: Self-pay | Admitting: Internal Medicine

## 2024-03-27 ENCOUNTER — Ambulatory Visit: Payer: Self-pay | Admitting: Internal Medicine

## 2024-03-27 VITALS — BP 114/82 | HR 72 | Temp 98.1°F | Ht 70.0 in | Wt 232.6 lb

## 2024-03-27 DIAGNOSIS — R002 Palpitations: Secondary | ICD-10-CM

## 2024-03-27 DIAGNOSIS — D3500 Benign neoplasm of unspecified adrenal gland: Secondary | ICD-10-CM

## 2024-03-27 DIAGNOSIS — F419 Anxiety disorder, unspecified: Secondary | ICD-10-CM

## 2024-03-27 NOTE — Progress Notes (Signed)
 Established Patient Office Visit  Subjective:  Patient ID: Donald Hill, male    DOB: Sep 09, 1990  Age: 33 y.o. MRN: [id number]  Chief Complaint  Patient presents with   Establish Care    NPE    Here to establish IM care. C/o recent onset of chest pain and palpitations while driving and sought medical advice. Cardiac workup has been negative and wore a monitor for 14 days which he submitted for analysis yesterday. Also c/o right elbow pain after an episode of heavy lifting. Pain severe and affects his grip strength but hasn't taken any otc nsaids. Denies anxiety or stress. Admits to marijuana use but denies using laced products and had been using that batch for a month without any adverse effects.    No other concerns at this time.   Past Medical History:  Diagnosis Date   HTN (hypertension)    Palpitations     Past Surgical History:  Procedure Laterality Date   HERNIA REPAIR     in childhood    Social History   Socioeconomic History   Marital status: Single    Spouse name: Not on file   Number of children: Not on file   Years of education: Not on file   Highest education level: High school graduate  Occupational History   Not on file  Tobacco Use   Smoking status: Former    Types: Cigarettes   Smokeless tobacco: Not on file  Vaping Use   Vaping status: Every Day   Substances: Nicotine  Substance and Sexual Activity   Alcohol use: Not Currently    Comment: rarely   Drug use: Yes    Types: Marijuana   Sexual activity: Not Currently  Other Topics Concern   Not on file  Social History Narrative   Not on file   Social Drivers of Health   Financial Resource Strain: Low Risk  (03/09/2024)   Overall Financial Resource Strain (CARDIA)    Difficulty of Paying Living Expenses: Not very hard  Food Insecurity: No Food Insecurity (03/09/2024)   Hunger Vital Sign    Worried About Running Out of Food in the Last Year: Never true    Ran Out of Food in the Last Year:  Never true  Transportation Needs: No Transportation Needs (03/09/2024)   PRAPARE - Administrator, Civil Service (Medical): No    Lack of Transportation (Non-Medical): No  Physical Activity: Not on file  Stress: Not on file  Social Connections: Not on file  Intimate Partner Violence: Not on file    History reviewed. No pertinent family history.  No Known Allergies  No outpatient medications prior to visit.   No facility-administered medications prior to visit.    Review of Systems  Constitutional: Negative.   HENT: Negative.    Eyes: Negative.   Respiratory: Negative.    Cardiovascular: Negative.   Gastrointestinal: Negative.   Genitourinary: Negative.   Skin: Negative.   Neurological: Negative.   Endo/Heme/Allergies: Negative.        Objective:   BP 114/82   Pulse 72   Temp 98.1 F (36.7 C)   Ht 5' 10 (1.778 m)   Wt 232 lb 9.6 oz (105.5 kg)   SpO2 97%   BMI 33.37 kg/m   Vitals:   03/27/24 1405  BP: 114/82  Pulse: 72  Temp: 98.1 F (36.7 C)  Height: 5' 10 (1.778 m)  Weight: 232 lb 9.6 oz (105.5 kg)  SpO2: 97%  BMI (  Calculated): 33.37    Physical Exam Vitals reviewed.  Constitutional:      Appearance: Normal appearance.  HENT:     Head: Normocephalic.     Left Ear: There is no impacted cerumen.     Nose: Nose normal.     Mouth/Throat:     Mouth: Mucous membranes are moist.     Pharynx: No posterior oropharyngeal erythema.  Eyes:     Extraocular Movements: Extraocular movements intact.     Pupils: Pupils are equal, round, and reactive to light.  Cardiovascular:     Rate and Rhythm: Regular rhythm.     Chest Wall: PMI is not displaced.     Pulses: Normal pulses.     Heart sounds: Normal heart sounds. No murmur heard. Pulmonary:     Effort: Pulmonary effort is normal.     Breath sounds: Normal air entry. No rhonchi or rales.  Abdominal:     General: Abdomen is flat. Bowel sounds are normal. There is no distension.      Palpations: Abdomen is soft. There is no hepatomegaly, splenomegaly or mass.     Tenderness: There is no abdominal tenderness.  Musculoskeletal:        General: Normal range of motion.     Cervical back: Normal range of motion and neck supple.     Right lower leg: No edema.     Left lower leg: No edema.  Skin:    General: Skin is warm and dry.  Neurological:     General: No focal deficit present.     Mental Status: He is alert and oriented to person, place, and time.     Cranial Nerves: No cranial nerve deficit.     Motor: No weakness.  Psychiatric:        Mood and Affect: Mood normal.        Behavior: Behavior normal.      No results found for any visits on 03/27/24.  Recent Results (from the past 2160 hours)  HgB A1c     Status: None   Collection Time: 03/08/24 12:02 PM  Result Value Ref Range   Hgb A1c MFr Bld 5.3 4.8 - 5.6 %    Comment:          Prediabetes: 5.7 - 6.4          Diabetes: >6.4          Glycemic control for adults with diabetes: <7.0    Est. average glucose Bld gHb Est-mCnc 105 mg/dL      Assessment & Plan:  [name] was seen today for establish care.  Anxiety  Pheochromocytoma, unspecified laterality -     Metanephrines, plasma  Palpitations -     Metanephrines, plasma -     TSH    Problem List Items Addressed This Visit   None Visit Diagnoses       Anxiety    -  Primary     Pheochromocytoma, unspecified laterality       Relevant Orders   Metanephrines, plasma     Palpitations       Relevant Orders   Metanephrines, plasma   TSH       Return in about 2 weeks (around 04/10/2024).   Total time spent: 20 minutes  Donald Cinderella Perry, MD  03/27/2024   This document may have been prepared by [facility] Voice Recognition software and as such may include unintentional dictation errors.

## 2024-03-31 LAB — METANEPHRINES, PLASMA
Metanephrine, Free: 33.5 pg/mL (ref 0.0–88.0)
Normetanephrine, Free: 106.3 pg/mL (ref 0.0–210.1)

## 2024-03-31 LAB — TSH: TSH: 1.86 u[IU]/mL (ref 0.450–4.500)

## 2024-04-03 ENCOUNTER — Ambulatory Visit (HOSPITAL_COMMUNITY)
Admission: RE | Admit: 2024-04-03 | Discharge: 2024-04-03 | Disposition: A | Discharge status: Home / Self Care | Payer: Self-pay | Source: Ambulatory Visit | Attending: Cardiology | Admitting: Cardiology

## 2024-04-03 DIAGNOSIS — R000 Tachycardia, unspecified: Secondary | ICD-10-CM | POA: Insufficient documentation

## 2024-04-03 DIAGNOSIS — R072 Precordial pain: Secondary | ICD-10-CM | POA: Insufficient documentation

## 2024-04-03 LAB — ECHOCARDIOGRAM COMPLETE: S' Lateral: 3.18 cm

## 2024-04-03 MED ORDER — PERFLUTREN LIPID MICROSPHERE
1.0000 mL | INTRAVENOUS | Status: AC | PRN
  Administered 2024-04-03: 2 mL via INTRAVENOUS

## 2024-04-09 ENCOUNTER — Ambulatory Visit: Payer: Self-pay | Admitting: Internal Medicine

## 2024-04-14 DIAGNOSIS — R072 Precordial pain: Secondary | ICD-10-CM

## 2024-04-14 DIAGNOSIS — R000 Tachycardia, unspecified: Secondary | ICD-10-CM

## 2024-04-24 ENCOUNTER — Ambulatory Visit: Payer: Self-pay | Admitting: Internal Medicine

## 2024-04-24 VITALS — BP 112/72 | HR 71 | Temp 97.4°F | Ht 70.0 in | Wt 230.0 lb

## 2024-04-24 DIAGNOSIS — F419 Anxiety disorder, unspecified: Secondary | ICD-10-CM

## 2024-04-24 MED ORDER — BUSPIRONE HCL 7.5 MG PO TABS: 7.5000 mg | ORAL_TABLET | Freq: Two times a day (BID) | ORAL | 1 refills | Status: DC

## 2024-04-24 NOTE — Progress Notes (Signed)
 Established Patient Office Visit  Subjective:  Patient ID: Donald Hill, male    DOB: Nov 06, 1990  Age: 33 y.o. MRN: [id number]  Chief Complaint  Patient presents with  . Follow-up    Lab Results and Discuss Heart Monitor Results from Cards    Still c/o daily palpitations although not as severe as previously. Tsh and metanephrine screen was negative on lab review and awaits cardiology f/u.    No other concerns at this time.   Past Medical History:  Diagnosis Date  . HTN (hypertension)   . Palpitations     Past Surgical History:  Procedure Laterality Date  . HERNIA REPAIR     in childhood    Social History   Socioeconomic History  . Marital status: Single    Spouse name: Not on file  . Number of children: Not on file  . Years of education: Not on file  . Highest education level: High school graduate  Occupational History  . Not on file  Tobacco Use  . Smoking status: Former    Types: Cigarettes  . Smokeless tobacco: Not on file  Vaping Use  . Vaping status: Every Day  . Substances: Nicotine  Substance and Sexual Activity  . Alcohol use: Not Currently    Comment: rarely  . Drug use: Yes    Types: Marijuana  . Sexual activity: Not Currently  Other Topics Concern  . Not on file  Social History Narrative  . Not on file   Social Drivers of Health   Financial Resource Strain: Low Risk  (03/09/2024)   Overall Financial Resource Strain (CARDIA)   . Difficulty of Paying Living Expenses: Not very hard  Food Insecurity: No Food Insecurity (03/09/2024)   Hunger Vital Sign   . Worried About Programme Researcher, Broadcasting/film/video in the Last Year: Never true   . Ran Out of Food in the Last Year: Never true  Transportation Needs: No Transportation Needs (03/09/2024)   PRAPARE - Transportation   . Lack of Transportation (Medical): No   . Lack of Transportation (Non-Medical): No  Physical Activity: Not on file  Stress: Not on file  Social Connections: Not on file  Intimate Partner  Violence: Not on file    No family history on file.  No Known Allergies  No outpatient medications prior to visit.   No facility-administered medications prior to visit.    Review of Systems  Constitutional: Negative.   HENT: Negative.    Eyes: Negative.   Respiratory: Negative.    Cardiovascular: Negative.   Gastrointestinal: Negative.   Genitourinary: Negative.   Skin: Negative.   Neurological: Negative.   Endo/Heme/Allergies: Negative.        Objective:   BP 112/72   Pulse 71   Temp (!) 97.4 F (36.3 C) (Tympanic)   Ht 5' 10 (1.778 m)   Wt 230 lb (104.3 kg)   SpO2 98%   BMI 33.00 kg/m   Vitals:   04/24/24 1145  BP: 112/72  Pulse: 71  Temp: (!) 97.4 F (36.3 C)  Height: 5' 10 (1.778 m)  Weight: 230 lb (104.3 kg)  SpO2: 98%  TempSrc: Tympanic  BMI (Calculated): 33    Physical Exam Vitals reviewed.  Constitutional:      Appearance: Normal appearance.  HENT:     Head: Normocephalic.     Left Ear: There is no impacted cerumen.     Nose: Nose normal.     Mouth/Throat:     Mouth: Mucous  membranes are moist.     Pharynx: No posterior oropharyngeal erythema.  Eyes:     Extraocular Movements: Extraocular movements intact.     Pupils: Pupils are equal, round, and reactive to light.  Cardiovascular:     Rate and Rhythm: Regular rhythm.     Chest Wall: PMI is not displaced.     Pulses: Normal pulses.     Heart sounds: Normal heart sounds. No murmur heard. Pulmonary:     Effort: Pulmonary effort is normal.     Breath sounds: Normal air entry. No rhonchi or rales.  Abdominal:     General: Abdomen is flat. Bowel sounds are normal. There is no distension.     Palpations: Abdomen is soft. There is no hepatomegaly, splenomegaly or mass.     Tenderness: There is no abdominal tenderness.  Musculoskeletal:        General: Normal range of motion.     Cervical back: Normal range of motion and neck supple.     Right lower leg: No edema.     Left lower  leg: No edema.  Skin:    General: Skin is warm and dry.  Neurological:     General: No focal deficit present.     Mental Status: He is alert and oriented to person, place, and time.     Cranial Nerves: No cranial nerve deficit.     Motor: No weakness.  Psychiatric:        Mood and Affect: Mood normal.        Behavior: Behavior normal.      No results found for any visits on 04/24/24.  Recent Results (from the past 2160 hours)  HgB A1c     Status: None   Collection Time: 03/08/24 12:02 PM  Result Value Ref Range   Hgb A1c MFr Bld 5.3 4.8 - 5.6 %    Comment:          Prediabetes: 5.7 - 6.4          Diabetes: >6.4          Glycemic control for adults with diabetes: <7.0    Est. average glucose Bld gHb Est-mCnc 105 mg/dL  Metanephrines, plasma     Status: None   Collection Time: 03/27/24  2:51 PM  Result Value Ref Range   Normetanephrine, Free 106.3 0.0 - 210.1 pg/mL   Metanephrine, Free 33.5 0.0 - 88.0 pg/mL  TSH     Status: None   Collection Time: 03/27/24  2:51 PM  Result Value Ref Range   TSH 1.860 0.450 - 4.500 uIU/mL  ECHOCARDIOGRAM COMPLETE     Status: None   Collection Time: 04/03/24 11:09 AM  Result Value Ref Range   Donald Hill' Lateral 3.18 cm   Est EF 60 - 65%       Assessment & Plan:   Problem List Items Addressed This Visit   None Visit Diagnoses       Anxiety    -  Primary   Relevant Medications   busPIRone (BUSPAR) 7.5 MG tablet       Return in about 6 weeks (around 06/05/2024) for mood f/u.   Total time spent: 20 minutes. This time includes review of previous notes and results and patient face to face interaction during today'Donald Hill visit.    Donald Cinderella Perry, MD  04/24/2024   This document may have been prepared by [facility] Voice Recognition software and as such may include unintentional dictation errors.

## 2024-05-07 ENCOUNTER — Encounter: Payer: Self-pay | Admitting: *Deleted

## 2024-05-07 ENCOUNTER — Other Ambulatory Visit: Payer: Self-pay

## 2024-05-07 ENCOUNTER — Emergency Department: Payer: Self-pay

## 2024-05-07 ENCOUNTER — Emergency Department
Admission: EM | Admit: 2024-05-07 | Discharge: 2024-05-07 | Disposition: A | Discharge status: Home / Self Care | Payer: Self-pay | Attending: Emergency Medicine | Admitting: Emergency Medicine

## 2024-05-07 DIAGNOSIS — I10 Essential (primary) hypertension: Secondary | ICD-10-CM | POA: Insufficient documentation

## 2024-05-07 DIAGNOSIS — R079 Chest pain, unspecified: Secondary | ICD-10-CM | POA: Insufficient documentation

## 2024-05-07 LAB — CBC
HCT: 44.6 % (ref 39.0–52.0)
Hemoglobin: 15.6 g/dL (ref 13.0–17.0)
MCH: 29.3 pg (ref 26.0–34.0)
MCHC: 35 g/dL (ref 30.0–36.0)
MCV: 83.7 fL (ref 80.0–100.0)
Platelets: 259 K/uL (ref 150–400)
RBC: 5.33 MIL/uL (ref 4.22–5.81)
RDW: 12 % (ref 11.5–15.5)
WBC: 9.8 K/uL (ref 4.0–10.5)
nRBC: 0 % (ref 0.0–0.2)

## 2024-05-07 LAB — BASIC METABOLIC PANEL WITH GFR
Anion gap: 10 (ref 5–15)
BUN: 11 mg/dL (ref 6–20)
CO2: 29 mmol/L (ref 22–32)
Calcium: 9.7 mg/dL (ref 8.9–10.3)
Chloride: 99 mmol/L (ref 98–111)
Creatinine, Ser: 1.1 mg/dL (ref 0.61–1.24)
GFR, Estimated: >60 mL/min (ref >60)
Glucose, Bld: 104 mg/dL — ABNORMAL HIGH (ref 70–99)
Potassium: 3.3 mmol/L — ABNORMAL LOW (ref 3.5–5.1)
Sodium: 138 mmol/L (ref 135–145)

## 2024-05-07 LAB — TROPONIN T, HIGH SENSITIVITY: Troponin T High Sensitivity: <15 ng/L (ref 0–19)

## 2024-05-07 MED ORDER — HYDROXYZINE PAMOATE 100 MG PO CAPS: 100.0000 mg | ORAL_CAPSULE | Freq: Three times a day (TID) | ORAL | 0 refills | Status: AC | PRN

## 2024-05-07 NOTE — ED Provider Notes (Signed)
 [facility] Provider Note    Event Date/Time   First MD Initiated Contact with Patient 05/07/24 1945     (approximate)   History   Chest Pain   HPI   Donald Hill is a 33 y.o. male with PMH of HTN and palpitations presents for evaluation of chest pain since October. Patient endorses consistent chest pain with period where it becomes significantly worse.  He had an episode where he felt like his heart rate shot up which then causes chest pain, he feels lightheaded and feels sweaty all over.  Patient endorses when this happens it feels like he is going to die.  Patient states that this has happened 3 times.  Patient is currently being evaluated by cardiology for the symptoms and he has an appointment tomorrow.  Physical Exam   Triage Vital Signs: ED Triage Vitals  Encounter Vitals Group     BP 05/07/24 1810 (!) 129/94     Girls Systolic BP Percentile --      Girls Diastolic BP Percentile --      Boys Systolic BP Percentile --      Boys Diastolic BP Percentile --      Pulse Rate 05/07/24 1810 87     Resp 05/07/24 1810 (!) 22     Temp 05/07/24 1810 98.4 F (36.9 C)     Temp Source 05/07/24 1810 Oral     SpO2 05/07/24 1810 98 %     Weight 05/07/24 1808 230 lb (104.3 kg)     Height 05/07/24 1808 5' 9 (1.753 m)     Head Circumference --      Peak Flow --      Pain Score 05/07/24 1808 0     Pain Loc --      Pain Education --      Exclude from Growth Chart --     Most recent vital signs: Vitals:   05/07/24 1810 05/07/24 1824  BP: (!) 129/94 139/83  Pulse: 87 87  Resp: (!) 22 20  Temp: 98.4 F (36.9 C)   SpO2: 98% 100%   General: Awake, very anxious and agitated. CV:  Good peripheral perfusion.  RRR. Resp:  Normal effort.  CTAB. Abd:  No distention.  Other:     ED Results / Procedures / Treatments   Labs (all labs ordered are listed, but only abnormal results are displayed) Labs Reviewed  BASIC METABOLIC PANEL WITH GFR - Abnormal;  Notable for the following components:      Result Value   Potassium 3.3 (*)    Glucose, Bld 104 (*)    All other components within normal limits  CBC  TROPONIN T, HIGH SENSITIVITY  TROPONIN T, HIGH SENSITIVITY     EKG  ED provider interpretation: NSR with PVC  Vent. rate 90 BPM PR interval 140 ms QRS duration 82 ms QT/QTcB 340/415 ms P-R-T axes 57 73 38  RADIOLOGY  Chest x-ray obtained, I interpreted the images as well as reviewed the radiologist report, which was negative for any acute cardiopulmonary abnormalities.   PROCEDURES:  Critical Care performed: No  Procedures   MEDICATIONS ORDERED IN ED: Medications - No data to display   IMPRESSION / MDM / ASSESSMENT AND PLAN / ED COURSE  I reviewed the triage vital signs and the nursing notes.  33 year old male presents for evaluation of chest pain. VSS and patient is very anxious, agitated and argumentative on exam  Differential diagnosis includes, but is not limited to, ACS, PE, pneumonia, penumothorax, GERD, costochondritis, anxiety.  Patient's presentation is most consistent with acute complicated illness / injury requiring diagnostic workup.  CBC and BMP unremarkable aside from mild hypokalemia. Troponin is not elevated.  Patient refused second troponin.  EKG shows NSR with PVC. CXR is negative.   I explained to the patient that his workup is very reassuring and that I do not think there is any life-threatening cause of his chest pain.  Patient was demanding that I provide him with an explanation for his pain while in the emergency department.  I informed the patient that I thought his symptoms seems consistent with a panic attack.  Patient is adamant that it is not anxiety related and states that he has never had problems with his health before.  He was prescribed anxiety medication by his primary care provider 2 weeks ago which she has been taking and has not had any improvement in his  symptoms from..  I explained that it will likely take a few weeks for the medication to begin to take effect.  I offered as needed anxiety medication which patient was willing to try.  I advised patient to follow-up with his cardiologist tomorrow.  Reviewed return precautions.  Patient voiced understanding, questions were answered and he was stable at discharge.   FINAL CLINICAL IMPRESSION(S) / ED DIAGNOSES   Final diagnoses:  Chest pain, unspecified type     Rx / DC Orders   ED Discharge Orders          Ordered    hydrOXYzine  (VISTARIL ) 100 MG capsule  3 times daily PRN        05/07/24 2033             Note:  This document was prepared using Dragon voice recognition software and may include unintentional dictation errors.   Cleaster Tinnie DELENA DEVONNA 05/07/24 2034    Arlander Charleston, MD 05/07/24 2114

## 2024-05-07 NOTE — ED Triage Notes (Signed)
 Pt ambulatory to triage.  Pt has chest pain since October.  Pt has cardio appt tomorrow.  No n/v/  no sob  pt anxious.  Pt alert  speech clear.

## 2024-05-07 NOTE — ED Notes (Signed)
 Patient's mother to nurses desk stating patient feels bad. When I approached patient, he reports he feels like his head is on fire. Repeat vitals obtained.

## 2024-05-07 NOTE — Discharge Instructions (Addendum)
 Your blood work, EKG and chest xray were normal today. Please follow up with your cardiologist and return to the ED with any worsening symptoms.  I have sent medication for you to take as needed for anxiety.

## 2024-05-08 ENCOUNTER — Encounter: Payer: Self-pay | Admitting: Cardiovascular Disease

## 2024-05-08 ENCOUNTER — Ambulatory Visit: Payer: Self-pay | Attending: Cardiovascular Disease | Admitting: Cardiovascular Disease

## 2024-05-08 VITALS — BP 122/78 | HR 74 | Ht 70.0 in | Wt 230.2 lb

## 2024-05-08 DIAGNOSIS — R072 Precordial pain: Secondary | ICD-10-CM

## 2024-05-08 DIAGNOSIS — R000 Tachycardia, unspecified: Secondary | ICD-10-CM

## 2024-05-08 DIAGNOSIS — R002 Palpitations: Secondary | ICD-10-CM

## 2024-05-08 MED ORDER — METOPROLOL SUCCINATE ER 25 MG PO TB24: 25.0000 mg | ORAL_TABLET | Freq: Every day | ORAL | 3 refills | Status: AC

## 2024-05-08 NOTE — Progress Notes (Signed)
 Patient sees you on 12/2

## 2024-05-08 NOTE — Patient Instructions (Signed)
 Your physician recommends the following medication changes.    START TAKING: Toprol  25 mg daily.   Lab Work: No labs ordered today    Testing/Procedures: Your provider has ordered a exercise tolerance test. This test will evaluate the blood supply to your heart muscle during periods of exercise and rest. For this test, you will raise your heart rate by walking on a treadmill at different levels.   you may eat a light breakfast/ lunch prior to your procedure no caffeine for 24 hours prior to your test (coffee, tea, soft drinks, or chocolate)  no smoking/ vaping for 4 hours prior to your test you may take your regular medications the day of your test except for:   - hold Toprol  25 mg bring any inhalers with you to your test wear comfortable clothing & tennis/ non-skid shoes to walk on the treadmill  This will take place at [address] [facility])  [place] [address]   Follow-Up: At [facility], you and your health needs are our priority.  As part of our continuing mission to provide you with exceptional heart care, our providers are all part of one team.  This team includes your primary Cardiologist (physician) and Advanced Practice Providers or APPs (Physician Assistants and Nurse Practitioners) who all work together to provide you with the care you need, when you need it.  Your next appointment:   3 month(s)  Provider:   Deatrice Cage, MD

## 2024-05-08 NOTE — Progress Notes (Signed)
 Cardiology Office Note   Date:  05/08/2024   ID:  Donald Hill, DOB March 08, 1991, MRN [id number]  PCP:  Albina GORMAN Dine, MD  Cardiologist:   Deatrice Cage, MD   Chief Complaint  Patient presents with   Follow-up    6-8 week follow up /  pt complaints of chest pain, chest pressure or SOB,  all day every day. medication reviewed verbally with patient .       History of Present Illness: Donald Hill is a 33 y.o. male who presents for for a follow-up visit regarding palpitations and elevated blood pressure.  He has known history of tobacco use.  He uses marijuana intermittently as well. He was seen in our office in September for palpitations and chest pain.  He had an emergency room visit in September for the symptoms.  Troponin and D-dimer were negative.  EKG was unremarkable. He had CT calcium score which was 0.  2-week outpatient monitor showed sinus rhythm with rare PACs and rare PVCs.  Multiple triggered events did not correlate with arrhythmia. Echocardiogram showed normal LV systolic function with no significant valvular abnormalities.  He has disabling symptoms of left-sided chest pain with sudden increase and heart rate and tachycardia associated with dizziness.  He feels like he is having a heart attack and had 2 emergency room visits since September for this most recently yesterday.  Troponin and other workup was unremarkable.  He was placed on BuSpar  recently but reports no improvement in symptoms.  Past Medical History:  Diagnosis Date   HTN (hypertension)    Palpitations     Past Surgical History:  Procedure Laterality Date   HERNIA REPAIR     in childhood     Current Outpatient Medications  Medication Sig Dispense Refill   busPIRone  (BUSPAR ) 7.5 MG tablet Take 1 tablet (7.5 mg total) by mouth 2 (two) times daily. 60 tablet 1   hydrOXYzine  (VISTARIL ) 100 MG capsule Take 1 capsule (100 mg total) by mouth 3 (three) times daily as needed for itching. 30  capsule 0   No current facility-administered medications for this visit.    Allergies:   Patient has no known allergies.    Social History:  The patient  reports that he has quit smoking. His smoking use included cigarettes. He does not have any smokeless tobacco history on file. He reports that he does not currently use alcohol. He reports current drug use. Drug: Marijuana.   Family History:  The patient's family history is not on file.    ROS:  Please see the history of present illness.   Otherwise, review of systems are positive for none.   All other systems are reviewed and negative.    PHYSICAL EXAM: VS:  BP 122/78 (BP Location: Left Arm, Patient Position: Sitting, Cuff Size: Normal)   Pulse 74   Ht 5' 10 (1.778 m)   Wt 230 lb 3.2 oz (104.4 kg)   SpO2 98%   BMI 33.03 kg/m  , BMI Body mass index is 33.03 kg/m. GEN: Well nourished, well developed, in no acute distress  HEENT: normal  Neck: no JVD, carotid bruits, or masses Cardiac: RRR; no murmurs, rubs, or gallops,no edema  Respiratory:  clear to auscultation bilaterally, normal work of breathing GI: soft, nontender, nondistended, + BS MS: no deformity or atrophy  Skin: warm and dry, no rash Neuro:  Strength and sensation are intact Psych: euthymic mood, full affect   EKG:  EKG is ordered  today. The ekg ordered today demonstrates : Sinus rhythm with short PR Low voltage QRS When compared with ECG of 07-May-2024 18:06, Premature ventricular complexes are no longer Present    Recent Labs: 03/27/2024: TSH 1.860 05/07/2024: BUN 11; Creatinine, Ser 1.10; Hemoglobin 15.6; Platelets 259; Potassium 3.3; Sodium 138    Lipid Panel No results found for: CHOL, TRIG, HDL, CHOLHDL, VLDL, LDLCALC, LDLDIRECT    Wt Readings from Last 3 Encounters:  05/08/24 230 lb 3.2 oz (104.4 kg)  05/07/24 230 lb (104.3 kg)  04/24/24 230 lb (104.3 kg)          03/07/2024    2:36 PM  PAD Screen  Previous PAD dx? No   Previous surgical procedure? No  Pain with walking? No  Subsides with rest? No  Feet/toe relief with dangling? No  Painful, non-healing ulcers? No  Extremities discolored? No      ASSESSMENT AND PLAN:  1.  Chest pain: Atypical overall and his presentations are highly suggestive of panic attacks but he reports no improvement with anxiety medications.  I am going to obtain a treadmill stress test.  2.  Palpitations and tachycardia: I reviewed the results of outpatient monitor with him which showed normal significant arrhythmia.  Multiple triggered events correlated with sinus rhythm.  I suspect he might benefit from a beta-blocker for symptomatic relief.  I elected to start him on Toprol  25 mg once daily.  3.  Uncontrolled anxiety: He should follow-up with his primary care physician or consider referral to a psychiatrist.    Disposition:   FU in 3 months. Signed,  Deatrice Cage, MD  05/08/2024 1:11 PM    [facility]

## 2024-05-14 ENCOUNTER — Encounter: Payer: Self-pay | Admitting: Internal Medicine

## 2024-05-15 ENCOUNTER — Ambulatory Visit: Payer: Self-pay | Admitting: Internal Medicine

## 2024-05-15 ENCOUNTER — Encounter: Payer: Self-pay | Admitting: Internal Medicine

## 2024-05-15 VITALS — BP 114/84 | HR 60 | Temp 97.9°F | Ht 70.0 in | Wt 232.4 lb

## 2024-05-15 DIAGNOSIS — R0789 Other chest pain: Secondary | ICD-10-CM

## 2024-05-15 DIAGNOSIS — F419 Anxiety disorder, unspecified: Secondary | ICD-10-CM

## 2024-05-15 DIAGNOSIS — R002 Palpitations: Secondary | ICD-10-CM

## 2024-05-15 MED ORDER — BUSPIRONE HCL 15 MG PO TABS: 15.0000 mg | ORAL_TABLET | Freq: Two times a day (BID) | ORAL | 2 refills | Status: AC

## 2024-05-15 MED ORDER — TIZANIDINE HCL 2 MG PO TABS: 2.0000 mg | ORAL_TABLET | Freq: Three times a day (TID) | ORAL | 2 refills | Status: AC | PRN

## 2024-05-15 MED ORDER — IBUPROFEN 800 MG PO TABS: 800.0000 mg | ORAL_TABLET | Freq: Three times a day (TID) | ORAL | 0 refills | Status: AC | PRN

## 2024-05-15 NOTE — Progress Notes (Signed)
 Established Patient Office Visit  Subjective:  Patient ID: Donald Hill, male    DOB: July 10, 1990  Age: 33 y.o. MRN: [id number]  Chief Complaint  Patient presents with   Follow-up    Hospital follow up    No new complaints, anxiety has slightly improved. C/o left upper chest pain    No other concerns at this time.   Past Medical History:  Diagnosis Date   HTN (hypertension)    Palpitations     Past Surgical History:  Procedure Laterality Date   HERNIA REPAIR     in childhood    Social History   Socioeconomic History   Marital status: Single    Spouse name: Not on file   Number of children: Not on file   Years of education: Not on file   Highest education level: High school graduate  Occupational History   Not on file  Tobacco Use   Smoking status: Former    Types: Cigarettes   Smokeless tobacco: Not on file  Vaping Use   Vaping status: Every Day   Substances: Nicotine  Substance and Sexual Activity   Alcohol use: Not Currently    Comment: rarely   Drug use: Yes    Types: Marijuana   Sexual activity: Not Currently  Other Topics Concern   Not on file  Social History Narrative   Not on file   Social Drivers of Health   Financial Resource Strain: Low Risk  (03/09/2024)   Overall Financial Resource Strain (CARDIA)    Difficulty of Paying Living Expenses: Not very hard  Food Insecurity: No Food Insecurity (03/09/2024)   Hunger Vital Sign    Worried About Running Out of Food in the Last Year: Never true    Ran Out of Food in the Last Year: Never true  Transportation Needs: No Transportation Needs (03/09/2024)   PRAPARE - Administrator, Civil Service (Medical): No    Lack of Transportation (Non-Medical): No  Physical Activity: Not on file  Stress: Not on file  Social Connections: Not on file  Intimate Partner Violence: Not on file    No family history on file.  No Known Allergies  Outpatient Medications Prior to Visit  Medication  Sig   metoprolol  succinate (TOPROL  XL) 25 MG 24 hr tablet Take 1 tablet (25 mg total) by mouth daily.   [DISCONTINUED] busPIRone  (BUSPAR ) 7.5 MG tablet Take 1 tablet (7.5 mg total) by mouth 2 (two) times daily.   hydrOXYzine  (VISTARIL ) 100 MG capsule Take 1 capsule (100 mg total) by mouth 3 (three) times daily as needed for itching. (Patient not taking: Reported on 05/15/2024)   No facility-administered medications prior to visit.    Review of Systems  Constitutional: Negative.   HENT: Negative.    Eyes: Negative.   Respiratory: Negative.    Cardiovascular: Negative.   Gastrointestinal: Negative.   Genitourinary: Negative.   Skin: Negative.   Neurological: Negative.   Endo/Heme/Allergies: Negative.        Objective:   BP 114/84   Pulse 60   Temp 97.9 F (36.6 C)   Ht 5' 10 (1.778 m)   Wt 232 lb 6.4 oz (105.4 kg)   SpO2 98%   BMI 33.35 kg/m   Vitals:   05/15/24 1114  BP: 114/84  Pulse: 60  Temp: 97.9 F (36.6 C)  Height: 5' 10 (1.778 m)  Weight: 232 lb 6.4 oz (105.4 kg)  SpO2: 98%  BMI (Calculated): 33.35  Physical Exam Vitals reviewed.  Constitutional:      Appearance: Normal appearance.  HENT:     Head: Normocephalic.     Left Ear: There is no impacted cerumen.     Nose: Nose normal.     Mouth/Throat:     Mouth: Mucous membranes are moist.     Pharynx: No posterior oropharyngeal erythema.  Eyes:     Extraocular Movements: Extraocular movements intact.     Pupils: Pupils are equal, round, and reactive to light.  Cardiovascular:     Rate and Rhythm: Regular rhythm.     Chest Wall: PMI is not displaced.     Pulses: Normal pulses.     Heart sounds: Normal heart sounds. No murmur heard. Pulmonary:     Effort: Pulmonary effort is normal.     Breath sounds: Normal air entry. No rhonchi or rales.  Abdominal:     General: Abdomen is flat. Bowel sounds are normal. There is no distension.     Palpations: Abdomen is soft. There is no hepatomegaly,  splenomegaly or mass.     Tenderness: There is no abdominal tenderness.  Musculoskeletal:        General: Normal range of motion.     Cervical back: Normal range of motion and neck supple.     Right lower leg: No edema.     Left lower leg: No edema.  Skin:    General: Skin is warm and dry.  Neurological:     General: No focal deficit present.     Mental Status: He is alert and oriented to person, place, and time.     Cranial Nerves: No cranial nerve deficit.     Motor: No weakness.  Psychiatric:        Mood and Affect: Mood normal.        Behavior: Behavior normal.      No results found for any visits on 05/15/24.  Recent Results (from the past 2160 hours)  HgB A1c     Status: None   Collection Time: 03/08/24 12:02 PM  Result Value Ref Range   Hgb A1c MFr Bld 5.3 4.8 - 5.6 %    Comment:          Prediabetes: 5.7 - 6.4          Diabetes: >6.4          Glycemic control for adults with diabetes: <7.0    Est. average glucose Bld gHb Est-mCnc 105 mg/dL  Metanephrines, plasma     Status: None   Collection Time: 03/27/24  2:51 PM  Result Value Ref Range   Normetanephrine, Free 106.3 0.0 - 210.1 pg/mL   Metanephrine, Free 33.5 0.0 - 88.0 pg/mL  TSH     Status: None   Collection Time: 03/27/24  2:51 PM  Result Value Ref Range   TSH 1.860 0.450 - 4.500 uIU/mL  ECHOCARDIOGRAM COMPLETE     Status: None   Collection Time: 04/03/24 11:09 AM  Result Value Ref Range   Yomira Flitton' Lateral 3.18 cm   Est EF 60 - 65%   Basic metabolic panel     Status: Abnormal   Collection Time: 05/07/24  6:10 PM  Result Value Ref Range   Sodium 138 135 - 145 mmol/L   Potassium 3.3 (L) 3.5 - 5.1 mmol/L   Chloride 99 98 - 111 mmol/L   CO2 29 22 - 32 mmol/L   Glucose, Bld 104 (H) 70 - 99 mg/dL    Comment:  Glucose reference range applies only to samples taken after fasting for at least 8 hours.   BUN 11 6 - 20 mg/dL   Creatinine, Ser 8.89 0.61 - 1.24 mg/dL   Calcium 9.7 8.9 - 89.6 mg/dL   GFR, Estimated  >39 >39 mL/min    Comment: (NOTE) Calculated using the CKD-EPI Creatinine Equation (2021)    Anion gap 10 5 - 15    Comment: Performed at [facility], [address] [place], [place] [address]  CBC     Status: None   Collection Time: 05/07/24  6:10 PM  Result Value Ref Range   WBC 9.8 4.0 - 10.5 K/uL   RBC 5.33 4.22 - 5.81 MIL/uL   Hemoglobin 15.6 13.0 - 17.0 g/dL   HCT 55.3 60.9 - 47.9 %   MCV 83.7 80.0 - 100.0 fL   MCH 29.3 26.0 - 34.0 pg   MCHC 35.0 30.0 - 36.0 g/dL   RDW 87.9 88.4 - 84.4 %   Platelets 259 150 - 400 K/uL   nRBC 0.0 0.0 - 0.2 %    Comment: Performed at [facility], [address] [place], [place] [address]  Troponin T, High Sensitivity     Status: None   Collection Time: 05/07/24  6:10 PM  Result Value Ref Range   Troponin T High Sensitivity <15 0 - 19 ng/L    Comment: (NOTE) Biotin concentrations > 1000 ng/mL falsely decrease TnT results.  Serial cardiac troponin measurements are suggested.  Refer to the Links section for chest pain algorithms and additional  guidance. Performed at [facility], [address] [place], [place] [address]       Assessment & Plan:  Ran was seen today for follow-up.  Palpitations  Atypical chest pain -     tiZANidine HCl; Take 1 tablet (2 mg total) by mouth every 8 (eight) hours as needed for muscle spasms.  Dispense: 90 tablet; Refill: 2 -     Ibuprofen; Take 1 tablet (800 mg total) by mouth every 8 (eight) hours as needed.  Dispense: 30 tablet; Refill: 0  Anxiety -     busPIRone  HCl; Take 1 tablet (15 mg total) by mouth 2 (two) times daily.  Dispense: 60 tablet; Refill: 2    Problem List Items Addressed This Visit   None Visit Diagnoses       Palpitations    -  Primary     Atypical chest pain       Relevant Medications   tiZANidine (ZANAFLEX) 2 MG tablet   ibuprofen (ADVIL) 800 MG tablet     Anxiety       Relevant Medications   busPIRone  (BUSPAR ) 15 MG tablet        Return in about 3 months (around 08/13/2024) for Anxiety and atypical chest pain.   Total time spent: 20 minutes. This time includes review of previous notes and results and patient face to face interaction during today'Jasmen Emrich visit.    Sherrill Cinderella Perry, MD  05/15/2024   This document may have been prepared by [facility] Voice Recognition software and as such may include unintentional dictation errors.

## 2024-05-18 ENCOUNTER — Telehealth: Payer: Self-pay

## 2024-05-18 NOTE — Telephone Encounter (Signed)
 Pt LM asking for call back, did not state what he needed

## 2024-05-18 NOTE — Telephone Encounter (Signed)
 Called pt. Back vm box is full

## 2024-05-22 NOTE — Telephone Encounter (Signed)
 VM full

## 2024-07-25 ENCOUNTER — Ambulatory Visit: Payer: Self-pay

## 2024-08-08 ENCOUNTER — Ambulatory Visit: Payer: Self-pay | Admitting: Cardiovascular Disease
